# Patient Record
Sex: Female | Born: 2005 | Race: White | Hispanic: No | Marital: Single | Smoking: Never smoker
Health system: Southern US, Community
[De-identification: ages and names within clinical notes are randomized; demographics above are authoritative.]

## PROBLEM LIST (undated history)

## (undated) DIAGNOSIS — T7840XA Allergy, unspecified, initial encounter: Secondary | ICD-10-CM

## (undated) DIAGNOSIS — N390 Urinary tract infection, site not specified: Secondary | ICD-10-CM

## (undated) HISTORY — DX: Urinary tract infection, site not specified: N39.0

## (undated) HISTORY — DX: Allergy, unspecified, initial encounter: T78.40XA

---
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
## 2024-07-21 ENCOUNTER — Telehealth (HOSPITAL_BASED_OUTPATIENT_CLINIC_OR_DEPARTMENT_OTHER): Payer: Self-pay | Admitting: *Deleted

## 2024-07-21 NOTE — Telephone Encounter (Signed)
 Spoke with mother, Mercy.  Patient continues to have issues with chest discomfort with elevated blood pressure/HR  Scheduled patient an appointment for 12/18 when she is home from college  Advised to call back if worsening symptoms

## 2024-08-20 NOTE — Progress Notes (Addendum)
 "  Cardiology Office Note    Date:  08/24/2024  ID:  Sydney Moreno, DOB 06-03-2006, MRN [id number] PCP:  Samella Look, FNP  Cardiologist:  None  Electrophysiologist:  None   Chief Complaint: Lightheadedness, palpitations  History of Present Illness: Sydney   Sydney Moreno is a 18 y.o. female with visit-pertinent history of dysmenorrhea without any obvious identifiable cause and nonspecific bleeding disorder.  Patient evaluated by Dr. Liborio on 10/21/23 for persistently elevated heart rates associated with symptoms of tiredness, easy fatigability and chest pressure as well as infrequent episodes of lightheadedness and episodes of syncope.  Patient reported that for many months she has had persistently elevated heart rates, described a sensation of fast heartbeat which with further increase with any amount of activity.  At times associated lightheadedness.  She reported a couple of episodes of syncope.  At times with elevated heart rates reported a chest pressure like sensation.  EKG indicated sinus tachycardia with heart rate at 115 bpm.  It was noted that patient had a history of mildly elevated inflammatory markers, recommended ongoing monitoring with PCP.  Echocardiogram and 7-day ZIO monitor were ordered for further evaluation.  Echocardiogram on 10/26/2023 indicated LVEF 55 to 60%, no RWMA, diastolic parameters were normal, RV systolic function and size normal, normal PASP, mitral valve was normal in structure with no evidence of regurgitation or stenosis tricuspid valve regurgitation was mild to moderate, aortic valve regurgitation not visualized, no stenosis present.   7-day ZIO monitor indicated an average heart rate of 95 bpm ranging from 55-204 bpm.  Predominant underlying rhythm was sinus rhythm, she had 1 run of supraventricular tachycardia lasting 6 beats with a max rate of 156 bpm, isolated SVE's were rare, SVE couplets were rare and no SVE triplets were present.  34 triggered events and  33 diary entries correlated with sinus rhythm and sinus tachycardia and isolated ventricular ectopy.  It was recommended that she start her metoprolol  tartrate 12.5 mg twice daily.  Today she presents with her mother regarding increased episodes of lightheadedness, palpitations and chest pressure.  She reports that she has continued having palpitations and lightheadedness specifically with position changes even with starting of metoprolol .  She reports that she will intermittently also have chest discomfort typically associated with increased palpitations.  She does report some episodes of presyncope, reports last ankle episode was over 5 months ago.  She does occasionally do yoga although notes lifting her arms above her head causes difficulty.  She notes that after returning from class her heart rate can increase 240 bpm with blood pressures ranging from 135/90 to 140/90 but she could also have systolic blood pressures in the 80s intermittently, notes that she typically has lower blood pressures.  Her mother does note she has had problems with increased anxiety and has been questioned if she has Ehlers-Danlos.  Her mother notes that she has fibromyalgia. ROS: .   Today she denies lower extremity edema, melena, hematuria, hemoptysis, diaphoresis, weakness, orthopnea, and PND.  All other systems are reviewed and otherwise negative. Studies Reviewed: Sydney   EKG:  EKG is ordered today, personally reviewed, demonstrating EKG Interpretation Date/Time:  Thursday August 24 2024 10:05:53 EST Ventricular Rate:  82 PR Interval:  124 QRS Duration:  94 QT Interval:  386 QTC Calculation: 450 R Axis:   63  Text Interpretation: Normal sinus rhythm Normal ECG When compared with ECG of 21-Oct-2023 14:57, ST no longer depressed in Inferior leads T wave inversion no longer evident  in Inferior leads T wave inversion no longer evident in Lateral leads Confirmed by Renell Coaxum [phone] on 08/24/2024 10:15:28 AM   CV  Studies: Cardiac studies reviewed are outlined and summarized above. Otherwise please see EMR for full report. Cardiac Studies & Procedures   ______________________________________________________________________________________________     ECHOCARDIOGRAM  ECHOCARDIOGRAM COMPLETE 10/26/2023  Narrative ECHOCARDIOGRAM REPORT    Patient Name:   Sydney Moreno Date of Exam: 10/26/2023 Medical Rec #:  [id number]       Height:       68.0 in Accession #:    [id number]      Weight:       165.2 lb Date of Birth:  2006-05-30       BSA:          1.884 m Patient Age:    17 years        BP:           110/70 mmHg Patient Gender: F               HR:           90 bpm. Exam Location:  Outpatient  Procedure: 2D Echo, 3D Echo, Color Doppler, Cardiac Doppler and Strain Analysis (Both Spectral and Color Flow Doppler were utilized during procedure).  Indications:    Tachycardia  History:        Patient has no prior history of Echocardiogram examinations. Signs/Symptoms:Syncope and Dizziness/Lightheadedness; Risk Factors:Non-Smoker.  Sonographer:    Orvil Holmes RDCS Referring Phys: [id number] ALEAN SAUNDERS MADIREDDY  IMPRESSIONS   1. Left ventricular ejection fraction, by estimation, is 55 to 60%. Left ventricular ejection fraction by 3D volume is 59 %. The left ventricle has normal function. The left ventricle has no regional wall motion abnormalities. Left ventricular diastolic parameters were normal. The average left ventricular global longitudinal strain is -16.4 %. The global longitudinal strain is normal. 2. Right ventricular systolic function is normal. The right ventricular size is normal. There is normal pulmonary artery systolic pressure. The estimated right ventricular systolic pressure is 15.1 mmHg. 3. The mitral valve is normal in structure. No evidence of mitral valve regurgitation. No evidence of mitral stenosis. 4. Tricuspid valve regurgitation is mild to moderate. 5. The aortic  valve is normal in structure. Aortic valve regurgitation is not visualized. No aortic stenosis is present. 6. The inferior vena cava is normal in size with greater than 50% respiratory variability, suggesting right atrial pressure of 3 mmHg.  Conclusion(s)/Recommendation(s): Normal biventricular function without evidence of hemodynamically significant valvular heart disease.  FINDINGS Left Ventricle: Left ventricular ejection fraction, by estimation, is 55 to 60%. Left ventricular ejection fraction by 3D volume is 59 %. The left ventricle has normal function. The left ventricle has no regional wall motion abnormalities. The average left ventricular global longitudinal strain is -16.4 %. Strain was performed and the global longitudinal strain is normal. The left ventricular internal cavity size was normal in size. There is no left ventricular hypertrophy. Left ventricular diastolic parameters were normal.  Right Ventricle: The right ventricular size is normal. No increase in right ventricular wall thickness. Right ventricular systolic function is normal. There is normal pulmonary artery systolic pressure. The tricuspid regurgitant velocity is 1.74 m/s, and with an assumed right atrial pressure of 3 mmHg, the estimated right ventricular systolic pressure is 15.1 mmHg.  Left Atrium: Left atrial size was normal in size.  Right Atrium: Right atrial size was normal in size.  Pericardium: There is no evidence of  pericardial effusion.  Mitral Valve: The mitral valve is normal in structure. No evidence of mitral valve regurgitation. No evidence of mitral valve stenosis.  Tricuspid Valve: The tricuspid valve is normal in structure. Tricuspid valve regurgitation is mild to moderate. No evidence of tricuspid stenosis.  Aortic Valve: The aortic valve is normal in structure. Aortic valve regurgitation is not visualized. No aortic stenosis is present.  Pulmonic Valve: The pulmonic valve was normal in  structure. Pulmonic valve regurgitation is not visualized. No evidence of pulmonic stenosis.  Aorta: The aortic root is normal in size and structure.  Venous: The inferior vena cava is normal in size with greater than 50% respiratory variability, suggesting right atrial pressure of 3 mmHg.  IAS/Shunts: No atrial level shunt detected by color flow Doppler.  Additional Comments: 3D was performed not requiring image post processing on an independent workstation and was normal.   LEFT VENTRICLE PLAX 2D LVIDd:         4.62 cm         Diastology LVIDs:         3.06 cm         LV e' medial:    13.40 cm/s LV PW:         0.96 cm         LV E/e' medial:  4.5 LV IVS:        0.75 cm         LV e' lateral:   19.00 cm/s LVOT diam:     2.10 cm         LV E/e' lateral: 3.1 LV SV:         41 LV SV Index:   22              2D LVOT Area:     3.46 cm        Longitudinal Strain 2D Strain GLS  -16.4 % Avg:  3D Volume EF LV 3D EF:    Left ventricul ar ejection fraction by 3D volume is 59 %.  3D Volume EF: 3D EF:        59 % LV EDV:       103 ml LV ESV:       42 ml LV SV:        60 ml  RIGHT VENTRICLE RV Basal diam:  2.73 cm RV Mid diam:    3.02 cm RV S prime:     11.70 cm/s TAPSE (M-mode): 1.6 cm  LEFT ATRIUM             Index        RIGHT ATRIUM           Index LA diam:        3.20 cm 1.70 cm/m   RA Area:     10.30 cm LA Vol (A2C):   41.1 ml 21.81 ml/m  RA Volume:   21.00 ml  11.14 ml/m LA Vol (A4C):   34.4 ml 18.26 ml/m LA Biplane Vol: 41.0 ml 21.76 ml/m AORTIC VALVE LVOT Vmax:   72.10 cm/s LVOT Vmean:  46.900 cm/s LVOT VTI:    0.118 m  AORTA Ao Root diam: 2.70 cm Ao Asc diam:  2.70 cm  MITRAL VALVE               TRICUSPID VALVE MV Area (PHT): 4.89 cm    TR Peak grad:   12.1 mmHg MV Decel Time: 155 msec    TR Vmax:  174.00 cm/s MV E velocity: 59.80 cm/s MV A velocity: 37.50 cm/s  SHUNTS MV E/A ratio:  1.59        Systemic VTI:  0.12 m Systemic Diam: 2.10  cm  Sydney Parchment MD Electronically signed by Sydney Parchment MD Signature Date/Time: 10/26/2023/12:50:48 PM    Final    MONITORS  LONG TERM MONITOR (3-14 DAYS) 11/08/2023  Narrative   Predominantly sinus rhythm with average heart rate 94/min [ranging from 55 bpm to 204 bpm].   Ventricular and supraventricular ectopy burden, less than 1%.   Multiple triggered events and diary entries, most correlated with sinus rhythm and sinus tachycardia, however cannot entirely exclude ectopic atrial rhythm with some of these triggered events.   Patch Wear Time:  7 days and 7 hours (2025-02-13T15:52:12-498 to 2025-02-20T23:41:41-0500)  Patient had a min HR of 55 bpm, max HR of 204 bpm, and avg HR of 95 bpm. Predominant underlying rhythm was Sinus Rhythm. 1 run of Supraventricular Tachycardia occurred lasting 6 beats with a max rate of 156 bpm (avg 146 bpm). Isolated SVEs were rare (<1.0%, 1), SVE Couplets were rare (<1.0%, 1), and no SVE Triplets were present. Isolated VEs were rare (<1.0%, 139), VE Couplets were rare (<1.0%, 1), and no VE Triplets were present. Total 34 patient triggers and 33 diary entries mostly correlated with sinus rhythm and sinus tachycardia and isolated ventricular ectopy.  At times with sinus tachycardia documented with triggered events cannot entirely exclude ectopic atrial tachycardia.       ______________________________________________________________________________________________       Current Reported Medications:.    Active Medications[1]  Physical Exam:    VS:  BP 102/64   Pulse 82   Ht 5' 8 (1.727 m)   Wt 190 lb (86.2 kg)   SpO2 99%   BMI 28.89 kg/m    Wt Readings from Last 3 Encounters:  08/24/24 190 lb (86.2 kg) (96%, Z= 1.80)*  10/21/23 165 lb 3.2 oz (74.9 kg) (91%, Z= 1.36)*  07/18/21 181 lb 12.8 oz (82.5 kg) (96%, Z= 1.81)*   * Growth percentiles are based on CDC (Girls, 2-20 Years) data.    GEN: Well nourished, well developed in no acute  distress NECK: No JVD; No carotid bruits CARDIAC: RRR, no murmurs, rubs, gallops RESPIRATORY:  Clear to auscultation without rales, wheezing or rhonchi  ABDOMEN: Soft, non-tender, non-distended EXTREMITIES:  No edema; No acute deformity     Asessement and Plan:.    POTS/tachycardia: Patient with history of tachycardia, low blood pressure, syncope, presyncope and chest discomfort.  Prior cardiac monitor as noted above with average heart rate 95 bpm, was previously started on metoprolol , she reports no significant improvements with metoprolol .  She continues to note increased palpitations described as heart racing, lightheadedness and chest discomfort with increased heart rates.  EKG today indicates normal sinus rhythm at 82 bpm. Orthostatics with 35 bpm increase in heart rate with associated heart racing and dizziness. Will have patient stop metoprolol  and start propanolol 10 mg twice daily. Reviewed ED precautions.  She will wear 30 day monitor as noted below. Check CBC, CMET, Mag, TSH+T3+T4  Orthostatic VS for the past 24 hrs (Last 3 readings):  BP- Lying Pulse- Lying BP- Sitting Pulse- Sitting BP- Standing at 0 minutes Pulse- Standing at 0 minutes BP- Standing at 3 minutes Pulse- Standing at 3 minutes  08/24/24 1012 102/64 90 100/70 123 102/68 128 90/50 125    Syncope: Patient reports prior episodes of syncope, most recent episode 5 months  ago.  She does note occasional episodes of presyncope in which she notes that her vision will go slightly blurry.  Orthostatic vitals as noted above, patient does have decrease in blood pressure at around 10 mmHg, does have significant increases in heart rate.  Will have patient stop metoprolol  and start propranolol  as noted above.  She is agreeable to repeat 30-day cardiac monitor given syncopal episodes.  Echocardiogram earlier this year reassuring.   Disposition: F/u with Tahlor Berenguer, NP in 8 weeks or sooner if needed.   ADDENDUM 09/06/24: Patient requested  transfer of care from Dr. Liborio to Dr. Lonni noting location of practice.  Both parties in agreement with transfer.  Signed, Rotha Cassels D Corwyn Vora, NP       [1]  Current Meds  Medication Sig   Ascorbic Acid (VITAMIN C) 1000 MG tablet Take 1,000 mg by mouth 2 (two) times daily.   CRYSELLE -28 0.3-30 MG-MCG tablet TAKE 1 TABLET BY MOUTH DAILY. FOR CONTINUOUS CYCLING, DISCARD PLACEBO PILLS   Cyanocobalamin (VITAMIN B12 PO) Take 1 capsule by mouth daily.   Ferrous Sulfate (IRON PO) Take 18 mg by mouth 2 (two) times daily.   MAGNESIUM PO Take 3 capsules by mouth daily.   Misc Natural Products (CORTISOL PO) Take 1 capsule by mouth daily. Corisol manager supplement   Multiple Vitamins-Minerals (ZINC PO) Take 18 mg by mouth daily.   NP THYROID  30 MG tablet Take 30 mg by mouth every morning.   propranolol  (INDERAL ) 10 MG tablet Take 1 tablet (10 mg total) by mouth 2 (two) times daily.   VITAMIN D , CHOLECALCIFEROL, PO Take 5,000 mg by mouth every other day.   [DISCONTINUED] metoprolol  tartrate (LOPRESSOR ) 25 MG tablet Take 0.5 tablets (12.5 mg total) by mouth 2 (two) times daily.   "

## 2024-08-24 ENCOUNTER — Encounter: Payer: Self-pay | Admitting: Cardiology

## 2024-08-24 ENCOUNTER — Ambulatory Visit: Attending: Cardiovascular Disease | Admitting: Cardiology

## 2024-08-24 VITALS — BP 102/64 | HR 82 | Ht 68.0 in | Wt 190.0 lb

## 2024-08-24 DIAGNOSIS — R55 Syncope and collapse: Secondary | ICD-10-CM

## 2024-08-24 DIAGNOSIS — G90A Postural orthostatic tachycardia syndrome (POTS): Secondary | ICD-10-CM | POA: Diagnosis not present

## 2024-08-24 DIAGNOSIS — R000 Tachycardia, unspecified: Secondary | ICD-10-CM

## 2024-08-24 MED ORDER — PROPRANOLOL HCL 10 MG PO TABS: 10.0000 mg | ORAL_TABLET | Freq: Two times a day (BID) | ORAL | 3 refills | Status: AC

## 2024-08-24 NOTE — Patient Instructions (Addendum)
 Medication Instructions:  Stop Metoprolol   Start Propranolol  10 mg take one tablet twice daily *If you need a refill on your cardiac medications before your next appointment, please call your pharmacy*  Lab Work: Today- CMET, CBC, TSH T4 T3, Magnesium If you have labs (blood work) drawn today and your tests are completely normal, you will receive your results only by: MyChart Message (if you have MyChart) OR A paper copy in the mail If you have any lab test that is abnormal or we need to change your treatment, we will call you to review the results.  Testing/Procedures: Your physician has recommended that you wear an event monitor. Event monitors are medical devices that record the hearts electrical activity. Doctors most often us  these monitors to diagnose arrhythmias. Arrhythmias are problems with the speed or rhythm of the heartbeat. The monitor is a small, portable device. You can wear one while you do your normal daily activities. This is usually used to diagnose what is causing palpitations/syncope (passing out).   Follow-Up: At [facility], you and your health needs are our priority.  As part of our continuing mission to provide you with exceptional heart care, our providers are all part of one team.  This team includes your primary Cardiologist (physician) and Advanced Practice Providers or APPs (Physician Assistants and Nurse Practitioners) who all work together to provide you with the care you need, when you need it.  Your next appointment:   8 week(s)  Provider:   Jaxtin Raimondo, NP          Guidelines for Patients with POTS  DIETARY MEASURES: The initial treatment strategy includes making sure to get adequate hydration and sodium. - Oral fluid intake should be encouraged to a target of 3 L daily (100oz). - In addition to water, you can choose drinks higher in sodium. Fluids with extra salt include tomato juice, tomato soup or warm broth. Drinks high in electrolytes,  such as low calorie G2, Powerade Zero, and Propel, can also be helpful but be careful to avoid excess sugar. Make water your number one drink. - Eating smaller meals more often, and avoiding large heavy meals, may be helpful. - Stimulants such as coffee, tea, energy drinks, certain fizzy drinks and alcohol may make symptoms worse, as can refined sugar, carbohydrates and dairy products - keep track of your triggers. - Aim for a daily salt intake of 8-10g of sodium chloride. You can do this in the form of added salt to food, intake of higher sodium foods, and sports drinks with sodium - Dont over-rely on processed foods. Processed foods are easy to prepare and are appealing when you have reduced energy, but usually have less nutritional value. Beneficial salty snacks may include chicken or beef broth, vegetable broth, pickles, olives, salted fish like sardines/anchovies and nuts. Dont over-rely on snack chips and crackers for salt. - If you are having a hard time getting your salt in by foods, talk to your healthcare provider about sodium tablets. - Diet with high fiber and complex carbohydrates may help reduce blood glucose (sugar) spikes and lessen POTS symptoms. - Drink water and have something salty about 30 minutes before getting out of bed in the morning, if your dizziness is worse in the morning.  COMPRESSION STOCKINGS - Thigh high or waist high compression stockings can be helpful to alleviate the symptoms - seek out ones that are 30-41mmHg on the label. - Some people have tried athletic knee high compression stockings (can be  purchased  online or from sporting good stores). These often come in more colors/patterns. Although not as strong as medical compression stockings, these can be effective in helping with symptoms in some people.  EXERCISE -  Studies have shown that one of the most important things a patient with POTS can do is exercise. Many patients with POTS are physically  deconditioned and others are at risk for deconditioning by restricting their physical activity to manage POTS symptoms. - Start very slowly, exercise 5 minutes twice per day. Every week increase your exercise time by 3 minutes. Most patients with POTS feel better doing exercises that are recumbent (sitting down, such as using a stationary bike, rowing machine, or swimming).  - Gradually work your way up to a regular exercise program and incorporate walking. - Remember to stay hydrated and cool during your work-out. - Yoga and pilates have also suggested to be helpful.  REGULAR MOVEMENT - Isometric exercises involve contracting your muscles without actually moving your body. Isometrics squeeze the muscle and push the blood back toward the heart. They are simple to do and can be done lying in bed or seated in a comfortable chair. It's a good idea to do these in bed before getting up to prepare your body for sitting and standing. - Transition slowly with your body. Go from lying to sitting on the edge of the bed. Stay there for several minutes, allowing the body to naturally adjust to the change in position. Once you are standing, pause and wait before walking to allow blood pressure to adjust again. If you feel lightheaded at any point, wait for a few minutes in that position to see if it resolves. If not, then return to the prior position as your body isn't adjusting properly. SLOWLY is the key.  SLEEP - Try to maintain a typical sleep schedule. Go to bed consistently at a certain time and set a consistent time to wake up. The best sleep hygiene and good rest comes from staying consistent with your sleep schedule every day.  - Most people need 7 to 10 hours sleep at night. - You can try raising the head of your bed 6-10 inches to help alleviate POTS symptoms. The entire bed must be at an angle. Raising the head of the bed will reduce urine formation overnight and increase fluid volume in your circulation  in the morning.   BIRTH CONTROL MEDICATIONS - Certain specific birth control pills may exacerbate symptoms - this includes medicines like Yaz or Yasmin that specifically contain drosperinone as the progestin ingredient. Do not stop your medication without talking to the prescriber.   HELPFUL EXERCISE PROGRAM Microsoft Word - [email or web address]_Exercise_[email or web address]  POTSIE APP POTSie - An App for POTS Syndrome

## 2024-08-26 LAB — CBC
Hematocrit: 37.6 % (ref 34.0–46.6)
Hemoglobin: 12.3 g/dL (ref 11.1–15.9)
MCH: 29.3 pg (ref 26.6–33.0)
MCHC: 32.7 g/dL (ref 31.5–35.7)
MCV: 90 fL (ref 79–97)
Platelets: 352 x10E3/uL (ref 150–450)
RBC: 4.2 x10E6/uL (ref 3.77–5.28)
RDW: 12 % (ref 11.7–15.4)
WBC: 5.5 x10E3/uL (ref 3.4–10.8)

## 2024-08-26 LAB — COMPREHENSIVE METABOLIC PANEL WITH GFR
ALT: 5 IU/L (ref 0–32)
AST: 9 IU/L (ref 0–40)
Albumin: 4 g/dL (ref 4.0–5.0)
Alkaline Phosphatase: 54 IU/L (ref 42–106)
BUN/Creatinine Ratio: 17 (ref 9–23)
BUN: 10 mg/dL (ref 6–20)
Bilirubin Total: 0.2 mg/dL (ref 0.0–1.2)
CO2: 22 mmol/L (ref 20–29)
Calcium: 9.3 mg/dL (ref 8.7–10.2)
Chloride: 105 mmol/L (ref 96–106)
Creatinine, Ser: 0.58 mg/dL (ref 0.57–1.00)
Globulin, Total: 2.5 g/dL (ref 1.5–4.5)
Glucose: 102 mg/dL — ABNORMAL HIGH (ref 70–99)
Potassium: 4.2 mmol/L (ref 3.5–5.2)
Sodium: 142 mmol/L (ref 134–144)
Total Protein: 6.5 g/dL (ref 6.0–8.5)
eGFR: 134 mL/min/1.73

## 2024-08-26 LAB — TSH+T4F+T3FREE
Free T4: 1.15 ng/dL (ref 0.93–1.60)
T3, Free: 3.4 pg/mL (ref 2.3–5.0)
TSH: 1.25 u[IU]/mL (ref 0.450–4.500)

## 2024-08-26 LAB — MAGNESIUM: Magnesium: 2.2 mg/dL (ref 1.6–2.3)

## 2024-08-27 ENCOUNTER — Ambulatory Visit: Payer: Self-pay | Admitting: Cardiology

## 2024-09-20 ENCOUNTER — Telehealth: Payer: Self-pay

## 2024-09-20 NOTE — Telephone Encounter (Signed)
 Dr. Liborio received a critical alert tracing for this patient's heart monitor and it was reviewed by Dr. Liborio today. Dr. Liborio stated that it showed tachycardia with irregular RR interval which appears consistent with sinus tachycardia with heart rate up to 200 bpm on January 11.  Auto triggered event no patient triggered symptoms. Patient was called and she stated that she is feeling fine. She also reported that the heart monitor patch was not attached to her skin and the heart monitor phone said that she did not have good skin contact. She was instructed to change the heart monitor patch so it could have better skin contact. Patient verbalized understanding and had no further questions at this time.

## 2024-10-04 ENCOUNTER — Ambulatory Visit

## 2024-10-04 DIAGNOSIS — R55 Syncope and collapse: Secondary | ICD-10-CM

## 2024-10-16 ENCOUNTER — Ambulatory Visit: Admitting: Cardiology
